# Patient Record
Sex: Male | Born: 1960 | Hispanic: No | Smoking: Never smoker
Health system: Southern US, Community
[De-identification: ages and names within clinical notes are randomized; demographics above are authoritative.]

## PROBLEM LIST (undated history)

## (undated) HISTORY — PX: TONSILLECTOMY: SUR1361

---

## 2011-12-15 ENCOUNTER — Ambulatory Visit (INDEPENDENT_AMBULATORY_CARE_PROVIDER_SITE_OTHER): Payer: 59 | Admitting: Physician Assistant

## 2011-12-15 VITALS — BP 115/70 | HR 75 | Temp 98.7°F | Resp 18 | Ht 68.0 in | Wt 159.0 lb

## 2011-12-15 DIAGNOSIS — R059 Cough, unspecified: Secondary | ICD-10-CM

## 2011-12-15 DIAGNOSIS — R05 Cough: Secondary | ICD-10-CM

## 2011-12-15 DIAGNOSIS — J019 Acute sinusitis, unspecified: Secondary | ICD-10-CM

## 2011-12-15 MED ORDER — CEFDINIR 300 MG PO CAPS: 300.0000 mg | ORAL_CAPSULE | Freq: Two times a day (BID) | ORAL | Status: AC

## 2011-12-15 MED ORDER — BENZONATATE 100 MG PO CAPS: 100.0000 mg | ORAL_CAPSULE | Freq: Three times a day (TID) | ORAL | Status: AC | PRN

## 2011-12-15 MED ORDER — IPRATROPIUM BROMIDE 0.06 % NA SOLN: 2.0000 | Freq: Three times a day (TID) | NASAL | Status: AC

## 2011-12-15 MED ORDER — IPRATROPIUM BROMIDE 0.06 % NA SOLN: 2.0000 | Freq: Three times a day (TID) | NASAL | Status: DC

## 2011-12-15 MED ORDER — CEFDINIR 300 MG PO CAPS: 300.0000 mg | ORAL_CAPSULE | Freq: Two times a day (BID) | ORAL | Status: DC

## 2011-12-15 MED ORDER — BENZONATATE 100 MG PO CAPS: 100.0000 mg | ORAL_CAPSULE | Freq: Three times a day (TID) | ORAL | Status: DC | PRN

## 2011-12-15 NOTE — Progress Notes (Signed)
    Patient ID: Bradley Duncan MRN: [id number], DOB: 29-Feb-1961, 51 y.o. Date of Encounter: 12/15/2011, 9:12 AM  Primary Physician: Lolita Patella, MD, MD  Chief Complaint:  Chief Complaint  Patient presents with  . Sinusitis    couple days    HPI: 51 y.o. year old male presents with a three day history of nasal congestion, post nasal drip, sore throat, sinus pressure, and cough. Afebrile. No chills. Patient was outside working in his yard moving some straw around and feels like this may have flared up his allergies followed Nasal congestion thick and green/yellow. Sinus pressure is the worst symptom. Cough is productive secondary to post nasal drip and not associated with time of day. Ears feel full, leading to sensation of muffled hearing. Has tried OTC cold preps without success. No GI complaints. Appetite slightly decreased. Prone to sinus infections.  No recent antibiotics, recent travels, or sick contacts   No leg trauma, sedentary periods, h/o cancer, or tobacco use.  No past medical history on file.   Home Meds: Prior to Admission medications   Not on File    Allergies: No Known Allergies  History   Social History  . Marital Status: Unknown    Spouse Name: N/A    Number of Children: N/A  . Years of Education: N/A   Occupational History  . Not on file.   Social History Main Topics  . Smoking status: Never Smoker   . Smokeless tobacco: Not on file  . Alcohol Use: Not on file  . Drug Use: Not on file  . Sexually Active: Not on file   Other Topics Concern  . Not on file   Social History Narrative  . No narrative on file     Review of Systems: Constitutional: negative for chills, fever, night sweats or weight changes Cardiovascular: negative for chest pain or palpitations Respiratory: negative for hemoptysis, wheezing, or shortness of breath Abdominal: negative for abdominal pain, nausea, vomiting or diarrhea Dermatological: negative for  rash Neurologic: negative for headache   Physical Exam: Blood pressure 115/70, pulse 75, temperature 98.7 F (37.1 C), temperature source Oral, resp. rate 18, height 5\' 8"  (1.727 m), weight 159 lb (72.122 kg)., Body mass index is 24.18 kg/(m^2). General: Well developed, well nourished, in no acute distress. Head: Normocephalic, atraumatic, eyes without discharge, sclera non-icteric, nares are congested. Bilateral auditory canals clear, TM's are without perforation, pearly grey with reflective cone of light bilaterally. Serous effusion bilaterally behind TM's. Frontal sinus TTP. Oral cavity moist, dentition normal. Posterior pharynx with post nasal drip and mild erythema. No peritonsillar abscess or tonsillar exudate. Neck: Supple. No thyromegaly. Full ROM. No lymphadenopathy. Lungs: Clear bilaterally to auscultation without wheezes, rales, or rhonchi. Breathing is unlabored.  Heart: RRR with S1 S2. No murmurs, rubs, or gallops appreciated. Msk:  Strength and tone normal for age. Extremities: No clubbing or cyanosis. No edema. Neuro: Alert and oriented X 3. Moves all extremities spontaneously. CNII-XII grossly in tact. Psych:  Responds to questions appropriately with a normal affect.     ASSESSMENT AND PLAN:  51 y.o. year old male with sinusitis -Omnicef 300 mg 1 po bid #20 no RF  -Atrovent NS 0.06% 2 sprays each nare bid prn #1 no RF -Tessalon Perles 100 mg 1 po tid prn cough #30 no RF  -Mucinex -Tylenol/Motrin prn -Rest/fluids -RTC precautions -RTC 3-5 days if no improvement  Signed, Eula Listen, PA-C 12/15/2011 9:12 AM

## 2012-09-18 ENCOUNTER — Ambulatory Visit: Payer: 59 | Admitting: Family Medicine

## 2012-09-18 VITALS — BP 118/81 | HR 93 | Temp 99.6°F | Resp 16 | Ht 68.0 in | Wt 159.0 lb

## 2012-09-18 DIAGNOSIS — J029 Acute pharyngitis, unspecified: Secondary | ICD-10-CM

## 2012-09-18 MED ORDER — HYDROCODONE-HOMATROPINE 5-1.5 MG/5ML PO SYRP: 5.0000 mL | ORAL_SOLUTION | ORAL | Status: DC | PRN

## 2012-09-18 NOTE — Progress Notes (Signed)
Subjective: Patient started feeling ill Friday night, but mostly Saturday. He has a bad sore throat. He has some pain into his ears. Not much of a cough. No rhinorrhea. Generally he is a healthy person. Takes an aspirin, but not a lot of prescription medications. Dr. Renato Gails is his regular physician.  Objective: Pleasant gentleman in no major distress but doesn't feel well. His TMs are normal. Throat erythematous without exudate. It's not even real red. Neck supple without significant nodes. Chest is clear to auscultation.  Assessment: Pharyngitis  Plan: Strep screen  Results for orders placed in visit on 09/18/12  POCT RAPID STREP A (OFFICE)      Result Value Range   Rapid Strep A Screen Negative  Negative

## 2012-09-18 NOTE — Patient Instructions (Addendum)
Plenty of fluids  Get sufficient rest  If getting worse contact me or return  If needed get the cough syrup filled and begin using it. It will make you drowsy.

## 2016-07-06 DIAGNOSIS — Z0000 Encounter for general adult medical examination without abnormal findings: Secondary | ICD-10-CM | POA: Diagnosis not present

## 2016-07-06 DIAGNOSIS — Z13220 Encounter for screening for lipoid disorders: Secondary | ICD-10-CM | POA: Diagnosis not present

## 2016-07-06 DIAGNOSIS — Z125 Encounter for screening for malignant neoplasm of prostate: Secondary | ICD-10-CM | POA: Diagnosis not present

## 2016-07-22 ENCOUNTER — Ambulatory Visit (INDEPENDENT_AMBULATORY_CARE_PROVIDER_SITE_OTHER): Payer: BLUE CROSS/BLUE SHIELD | Admitting: Emergency Medicine

## 2016-07-22 ENCOUNTER — Encounter: Payer: Self-pay | Admitting: Emergency Medicine

## 2016-07-22 VITALS — BP 124/82 | HR 90 | Temp 99.1°F | Resp 16 | Ht 68.0 in | Wt 161.0 lb

## 2016-07-22 DIAGNOSIS — R05 Cough: Secondary | ICD-10-CM | POA: Diagnosis not present

## 2016-07-22 DIAGNOSIS — R059 Cough, unspecified: Secondary | ICD-10-CM

## 2016-07-22 DIAGNOSIS — J111 Influenza due to unidentified influenza virus with other respiratory manifestations: Secondary | ICD-10-CM | POA: Diagnosis not present

## 2016-07-22 MED ORDER — OSELTAMIVIR PHOSPHATE 75 MG PO CAPS: 75.0000 mg | ORAL_CAPSULE | Freq: Two times a day (BID) | ORAL | 0 refills | Status: AC

## 2016-07-22 NOTE — Progress Notes (Signed)
Bradley Duncan 56 y.o.   Chief Complaint  Patient presents with  . Generalized Body Aches    Symptoms began yesterday, wife positive for Flu yesterday   . Chills  . Cough  . Nasal Congestion    HISTORY OF PRESENT ILLNESS: This is a 56 y.o. male complaining of flu symptoms; wife diagnosed with flu yesterday and started on Tamiflu.  Cough  Associated symptoms include chills, a fever, headaches and myalgias. Pertinent negatives include no chest pain, ear congestion, ear pain, hemoptysis, nasal congestion, postnasal drip, rash, rhinorrhea, sore throat, shortness of breath, weight loss or wheezing.  Influenza  This is a new problem. The current episode started yesterday. The problem occurs constantly. The problem has been rapidly worsening. Associated symptoms include arthralgias, chills, congestion, coughing, a fever, headaches, myalgias and weakness. Pertinent negatives include no abdominal pain, chest pain, nausea, rash, sore throat, swollen glands, urinary symptoms, vertigo or vomiting.     Prior to Admission medications   Medication Sig Start Date End Date Taking? Authorizing Provider  acetaminophen (TYLENOL) 500 MG chewable tablet Chew 1,000 mg by mouth every 6 (six) hours as needed for pain.   Yes Historical Provider, MD  aspirin 81 MG tablet Take 81 mg by mouth daily.   Yes Historical Provider, MD  Homeopathic Products [facility] COLD REMEDY NA) Place into the nose.   Yes Historical Provider, MD  ipratropium (ATROVENT) 0.06 % nasal spray Place 2 sprays into the nose 3 (three) times daily. 12/15/11 12/14/12  Raymon Mutton Dunn, PA-C    No Known Allergies  There are no active problems to display for this patient.   No past medical history on file.  Past Surgical History:  Procedure Laterality Date  . TONSILLECTOMY      Social History   Social History  . Marital status: Unknown    Spouse name: N/A  . Number of children: N/A  . Years of education: N/A   Occupational History  . Not  on file.   Social History Main Topics  . Smoking status: Never Smoker  . Smokeless tobacco: Never Used  . Alcohol use Not on file  . Drug use: Unknown  . Sexual activity: Not on file   Other Topics Concern  . Not on file   Social History Narrative  . No narrative on file    Family History  Problem Relation Age of Onset  . Hypertension Mother   . Heart disease Father      Review of Systems  Constitutional: Positive for chills and fever. Negative for weight loss.  HENT: Positive for congestion. Negative for ear pain, postnasal drip, rhinorrhea and sore throat.   Eyes: Negative.   Respiratory: Positive for cough. Negative for hemoptysis, shortness of breath and wheezing.   Cardiovascular: Negative for chest pain.  Gastrointestinal: Negative for abdominal pain, nausea and vomiting.  Genitourinary: Negative.   Musculoskeletal: Positive for arthralgias and myalgias.  Skin: Negative.  Negative for rash.  Neurological: Positive for weakness and headaches. Negative for vertigo.  Endo/Heme/Allergies: Negative.   Psychiatric/Behavioral: Negative.   All other systems reviewed and are negative.  Vitals:   07/22/16 0825  BP: 124/82  Pulse: 90  Resp: 16  Temp: 99.1 F (37.3 C)     Physical Exam  Constitutional: He is oriented to person, place, and time. He appears well-developed and well-nourished.  HENT:  Head: Normocephalic and atraumatic.  Nose: Nose normal.  Mouth/Throat: Oropharynx is clear and moist.  Eyes: Conjunctivae and EOM are normal. Pupils  are equal, round, and reactive to light.  Neck: Normal range of motion. Neck supple.  Cardiovascular: Normal rate, regular rhythm, normal heart sounds and intact distal pulses.   No murmur heard. Pulmonary/Chest: Effort normal and breath sounds normal. He has no wheezes.  Abdominal: Soft. Bowel sounds are normal. He exhibits no distension. There is no tenderness.  Musculoskeletal: Normal range of motion.  Lymphadenopathy:      He has no cervical adenopathy.  Neurological: He is alert and oriented to person, place, and time.  Skin: Skin is warm and dry. Capillary refill takes less than 2 seconds.  Psychiatric: He has a normal mood and affect. His behavior is normal.  Vitals reviewed.    ASSESSMENT & PLAN: Bradley Duncan was seen today for generalized body aches, chills, cough and nasal congestion.  Diagnoses and all orders for this visit:  Influenza with respiratory manifestation  Cough  Other orders -     oseltamivir (TAMIFLU) 75 MG capsule; Take 1 capsule (75 mg total) by mouth 2 (two) times daily.    Patient Instructions       IF you received an x-ray today, you will receive an invoice from [facility]. Please contact [facility] at [phone] with questions or concerns regarding your invoice.   IF you received labwork today, you will receive an invoice from [facility]. Please contact [facility] at [phone] with questions or concerns regarding your invoice.   Our billing staff will not be able to assist you with questions regarding bills from these companies.  You will be contacted with the lab results as soon as they are available. The fastest way to get your results is to activate your My Chart account. Instructions are located on the last page of this paperwork. If you have not heard from us regarding the results in 2 weeks, please contact this office.      Influenza, Adult Influenza ("the flu") is an infection in the lungs, nose, and throat (respiratory tract). It is caused by a virus. The flu causes many common cold symptoms, as well as a high fever and body aches. It can make you feel very sick. The flu spreads easily from person to person (is contagious). Getting a flu shot (influenza vaccination) every year is the best way to prevent the flu. Follow these instructions at home:  Take over-the-counter and prescription medicines only as told by your doctor.  Use a cool  mist humidifier to add moisture (humidity) to the air in your home. This can make it easier to breathe.  Rest as needed.  Drink enough fluid to keep your pee (urine) clear or pale yellow.  Cover your mouth and nose when you cough or sneeze.  Wash your hands with soap and water often, especially after you cough or sneeze. If you cannot use soap and water, use hand sanitizer.  Stay home from work or school as told by your doctor. Unless you are visiting your doctor, try to avoid leaving home until your fever has been gone for 24 hours without the use of medicine.  Keep all follow-up visits as told by your doctor. This is important. How is this prevented?  Getting a yearly (annual) flu shot is the best way to avoid getting the flu. You may get the flu shot in late summer, fall, or winter. Ask your doctor when you should get your flu shot.  Wash your hands often or use hand sanitizer often.  Avoid contact with people who are sick during cold and  flu season.  Eat healthy foods.  Drink plenty of fluids.  Get enough sleep.  Exercise regularly. Contact a doctor if:  You get new symptoms.  You have:  Chest pain.  Watery poop (diarrhea).  A fever.  Your cough gets worse.  You start to have more mucus.  You feel sick to your stomach (nauseous).  You throw up (vomit). Get help right away if:  You start to be short of breath or have trouble breathing.  Your skin or nails turn a bluish color.  You have very bad pain or stiffness in your neck.  You get a sudden headache.  You get sudden pain in your face or ear.  You cannot stop throwing up. This information is not intended to replace advice given to you by your health care provider. Make sure you discuss any questions you have with your health care provider. Document Released: 04/13/2008 Document Revised: 12/11/2015 Document Reviewed: 04/29/2015 Elsevier Interactive Patient Education  2017 Elsevier  Inc.      Edwina Barth, MD [facility]

## 2016-07-22 NOTE — Patient Instructions (Addendum)
     IF you received an x-ray today, you will receive an invoice from [facility]. Please contact [facility] at [phone] with questions or concerns regarding your invoice.   IF you received labwork today, you will receive an invoice from [facility]. Please contact [facility] at [phone] with questions or concerns regarding your invoice.   Our billing staff will not be able to assist you with questions regarding bills from these companies.  You will be contacted with the lab results as soon as they are available. The fastest way to get your results is to activate your My Chart account. Instructions are located on the last page of this paperwork. If you have not heard from us regarding the results in 2 weeks, please contact this office.      Influenza, Adult Influenza ("the flu") is an infection in the lungs, nose, and throat (respiratory tract). It is caused by a virus. The flu causes many common cold symptoms, as well as a high fever and body aches. It can make you feel very sick. The flu spreads easily from person to person (is contagious). Getting a flu shot (influenza vaccination) every year is the best way to prevent the flu. Follow these instructions at home:  Take over-the-counter and prescription medicines only as told by your doctor.  Use a cool mist humidifier to add moisture (humidity) to the air in your home. This can make it easier to breathe.  Rest as needed.  Drink enough fluid to keep your pee (urine) clear or pale yellow.  Cover your mouth and nose when you cough or sneeze.  Wash your hands with soap and water often, especially after you cough or sneeze. If you cannot use soap and water, use hand sanitizer.  Stay home from work or school as told by your doctor. Unless you are visiting your doctor, try to avoid leaving home until your fever has been gone for 24 hours without the use of medicine.  Keep all follow-up visits as told by your doctor.  This is important. How is this prevented?  Getting a yearly (annual) flu shot is the best way to avoid getting the flu. You may get the flu shot in late summer, fall, or winter. Ask your doctor when you should get your flu shot.  Wash your hands often or use hand sanitizer often.  Avoid contact with people who are sick during cold and flu season.  Eat healthy foods.  Drink plenty of fluids.  Get enough sleep.  Exercise regularly. Contact a doctor if:  You get new symptoms.  You have:  Chest pain.  Watery poop (diarrhea).  A fever.  Your cough gets worse.  You start to have more mucus.  You feel sick to your stomach (nauseous).  You throw up (vomit). Get help right away if:  You start to be short of breath or have trouble breathing.  Your skin or nails turn a bluish color.  You have very bad pain or stiffness in your neck.  You get a sudden headache.  You get sudden pain in your face or ear.  You cannot stop throwing up. This information is not intended to replace advice given to you by your health care provider. Make sure you discuss any questions you have with your health care provider. Document Released: 04/13/2008 Document Revised: 12/11/2015 Document Reviewed: 04/29/2015 Elsevier Interactive Patient Education  2017 Elsevier Inc.  

## 2017-07-25 DIAGNOSIS — Z0000 Encounter for general adult medical examination without abnormal findings: Secondary | ICD-10-CM | POA: Diagnosis not present

## 2017-07-25 DIAGNOSIS — Z13220 Encounter for screening for lipoid disorders: Secondary | ICD-10-CM | POA: Diagnosis not present

## 2017-08-25 DIAGNOSIS — R2232 Localized swelling, mass and lump, left upper limb: Secondary | ICD-10-CM | POA: Diagnosis not present

## 2018-04-13 DIAGNOSIS — H9313 Tinnitus, bilateral: Secondary | ICD-10-CM | POA: Diagnosis not present

## 2018-04-13 DIAGNOSIS — L03011 Cellulitis of right finger: Secondary | ICD-10-CM | POA: Diagnosis not present

## 2018-04-20 DIAGNOSIS — H6121 Impacted cerumen, right ear: Secondary | ICD-10-CM | POA: Diagnosis not present

## 2018-04-20 DIAGNOSIS — H6061 Unspecified chronic otitis externa, right ear: Secondary | ICD-10-CM | POA: Diagnosis not present

## 2018-04-20 DIAGNOSIS — H9313 Tinnitus, bilateral: Secondary | ICD-10-CM | POA: Diagnosis not present

## 2018-06-13 DIAGNOSIS — R1310 Dysphagia, unspecified: Secondary | ICD-10-CM | POA: Diagnosis not present

## 2018-06-19 ENCOUNTER — Other Ambulatory Visit: Payer: Self-pay | Admitting: Family Medicine

## 2018-06-19 DIAGNOSIS — R1310 Dysphagia, unspecified: Secondary | ICD-10-CM

## 2018-06-23 ENCOUNTER — Ambulatory Visit
Admission: RE | Admit: 2018-06-23 | Discharge: 2018-06-23 | Disposition: A | Discharge status: Home / Self Care | Payer: BLUE CROSS/BLUE SHIELD | Source: Ambulatory Visit | Attending: Family Medicine | Admitting: Family Medicine

## 2018-06-23 DIAGNOSIS — R1310 Dysphagia, unspecified: Secondary | ICD-10-CM

## 2018-06-23 DIAGNOSIS — K449 Diaphragmatic hernia without obstruction or gangrene: Secondary | ICD-10-CM | POA: Diagnosis not present

## 2018-07-27 DIAGNOSIS — R1310 Dysphagia, unspecified: Secondary | ICD-10-CM | POA: Diagnosis not present

## 2018-08-01 DIAGNOSIS — R6882 Decreased libido: Secondary | ICD-10-CM | POA: Diagnosis not present

## 2018-08-01 DIAGNOSIS — Z13220 Encounter for screening for lipoid disorders: Secondary | ICD-10-CM | POA: Diagnosis not present

## 2018-08-01 DIAGNOSIS — Z125 Encounter for screening for malignant neoplasm of prostate: Secondary | ICD-10-CM | POA: Diagnosis not present

## 2018-08-01 DIAGNOSIS — Z1159 Encounter for screening for other viral diseases: Secondary | ICD-10-CM | POA: Diagnosis not present

## 2018-08-01 DIAGNOSIS — Z0000 Encounter for general adult medical examination without abnormal findings: Secondary | ICD-10-CM | POA: Diagnosis not present

## 2018-08-01 DIAGNOSIS — Z23 Encounter for immunization: Secondary | ICD-10-CM | POA: Diagnosis not present

## 2018-08-03 DIAGNOSIS — R2232 Localized swelling, mass and lump, left upper limb: Secondary | ICD-10-CM | POA: Diagnosis not present

## 2018-08-09 DIAGNOSIS — R933 Abnormal findings on diagnostic imaging of other parts of digestive tract: Secondary | ICD-10-CM | POA: Diagnosis not present

## 2018-08-09 DIAGNOSIS — K222 Esophageal obstruction: Secondary | ICD-10-CM | POA: Diagnosis not present

## 2018-08-09 DIAGNOSIS — K2970 Gastritis, unspecified, without bleeding: Secondary | ICD-10-CM | POA: Diagnosis not present

## 2018-08-09 DIAGNOSIS — K449 Diaphragmatic hernia without obstruction or gangrene: Secondary | ICD-10-CM | POA: Diagnosis not present

## 2018-08-09 DIAGNOSIS — K210 Gastro-esophageal reflux disease with esophagitis: Secondary | ICD-10-CM | POA: Diagnosis not present

## 2018-08-09 DIAGNOSIS — R1310 Dysphagia, unspecified: Secondary | ICD-10-CM | POA: Diagnosis not present

## 2019-01-13 IMAGING — RF DG UGI W/ HIGH DENSITY W/KUB
13 of 15 series · 14 of 24 positions shown · non-contrast
Comparison: None.

CLINICAL DATA: Esophageal dysphagia.  Food getting stuck.

EXAM:
UPPER GI SERIES WITH KUB
TECHNIQUE: After obtaining a scout radiograph a routine upper GI series was
performed using thin and high density barium.
FLUOROSCOPY TIME:  Fluoroscopy Time:  3 minutes and 0 seconds
Radiation Exposure Index (if provided by the fluoroscopic device):
100 mGy
Number of Acquired Spot Images: 4

[Series 1: one shot · 0.15mm/px · 1 of 1 slices shown (1 of 5)]
[im 1/1]
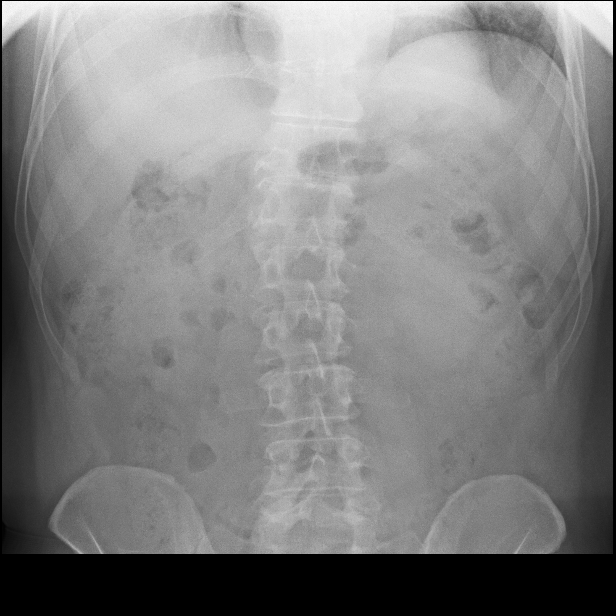

[Series 2: sequence · 1 of 11 frames shown (1 of 8)]
[frame 10/11]
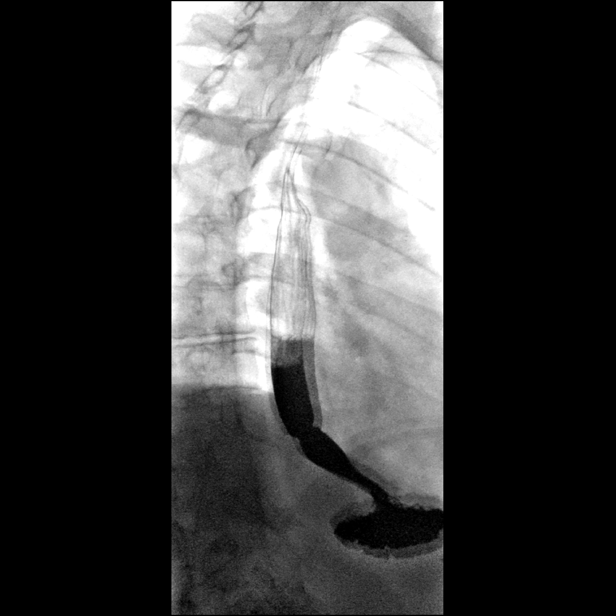

[Series 4: sequence · 0.29mm/px · 1 of 8 frames shown (2 of 8)]
[frame 5/8]
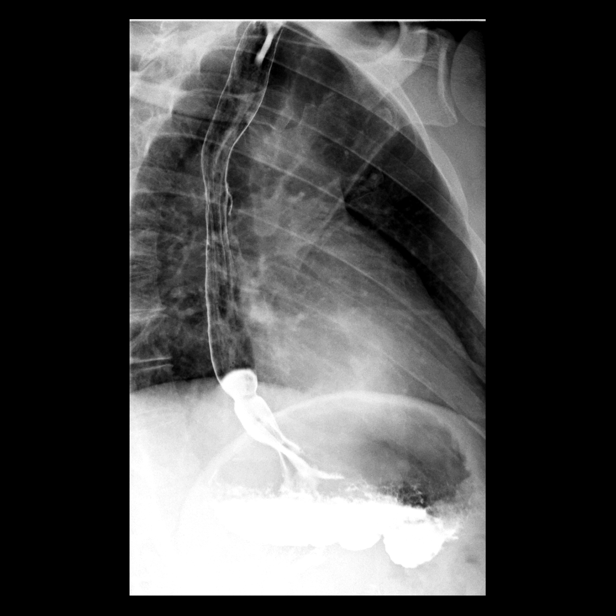

[Series 6: sequence · 0.29mm/px · 1 of 1 slices shown (3 of 8)]
[im 1/1]
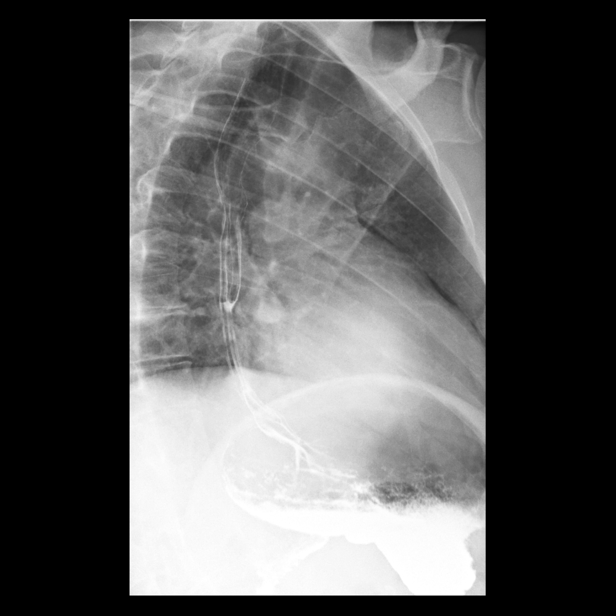

[Series 7: one shot · 0.15mm/px · 2 of 5 slices shown (2 of 5)]
[im 2/5]
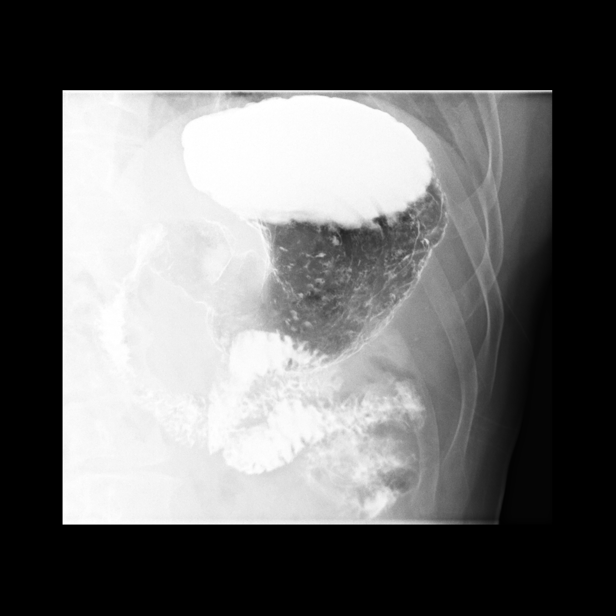
[im 5/5]
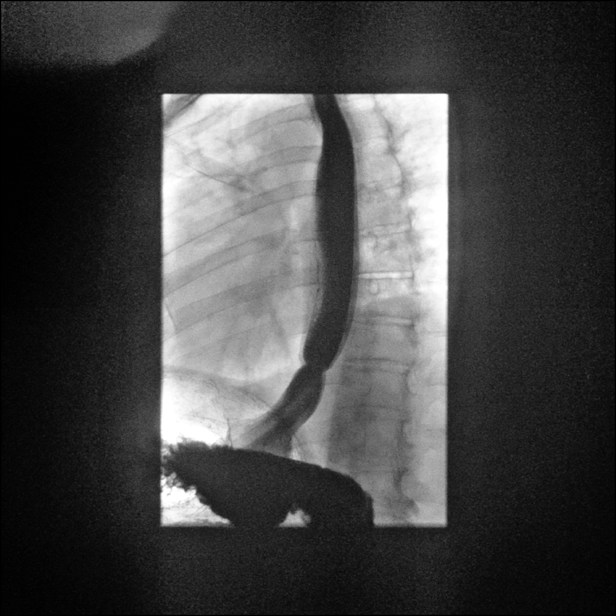

[Series 8: sequence · 1 of 11 frames shown (4 of 8)]
[frame 10/11]
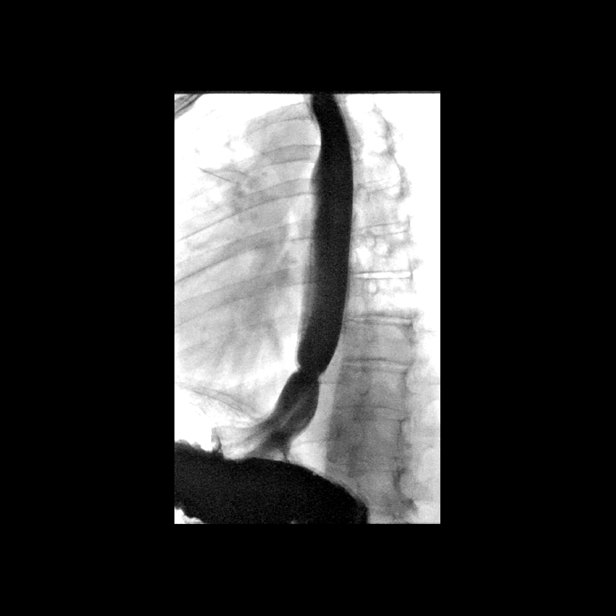

[Series 9: one shot · 1 of 1 slices shown (3 of 5)]
[im 1/1]
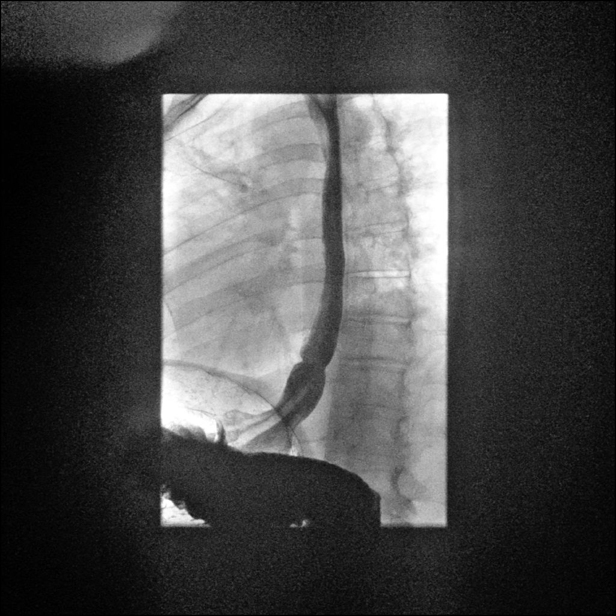

[Series 10: sequence · 1 of 7 frames shown (5 of 8)]
[frame 6/7]
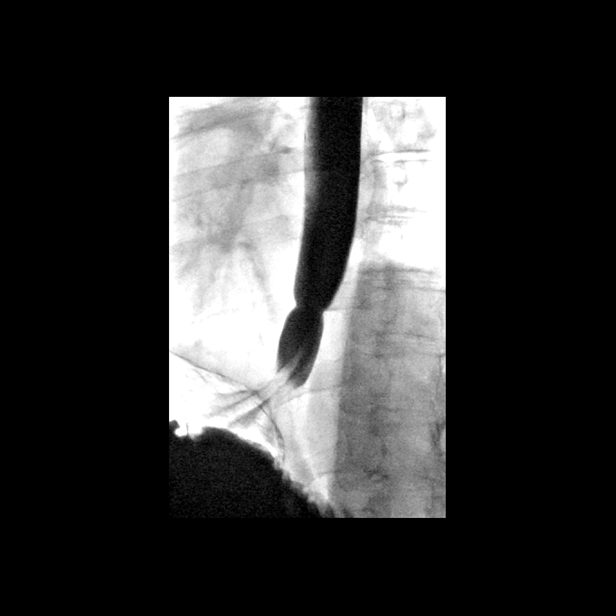

[Series 12: sequence · 1 of 11 frames shown (6 of 8)]
[frame 4/11]
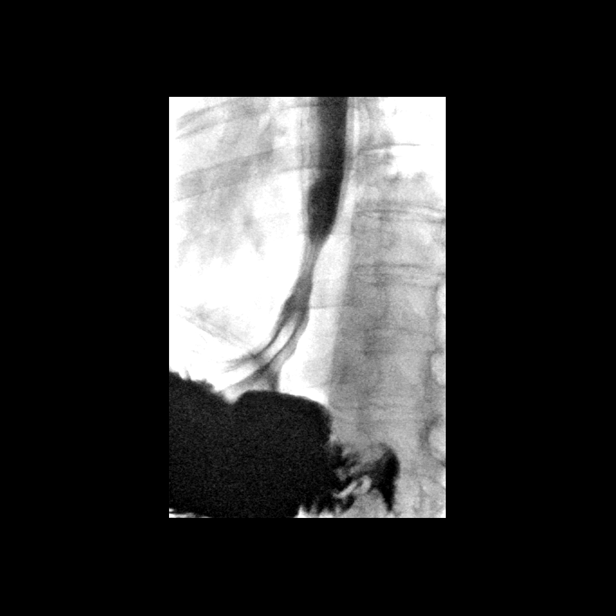

[Series 13: one shot · 1 of 2 slices shown (4 of 5)]
[im 2/2]
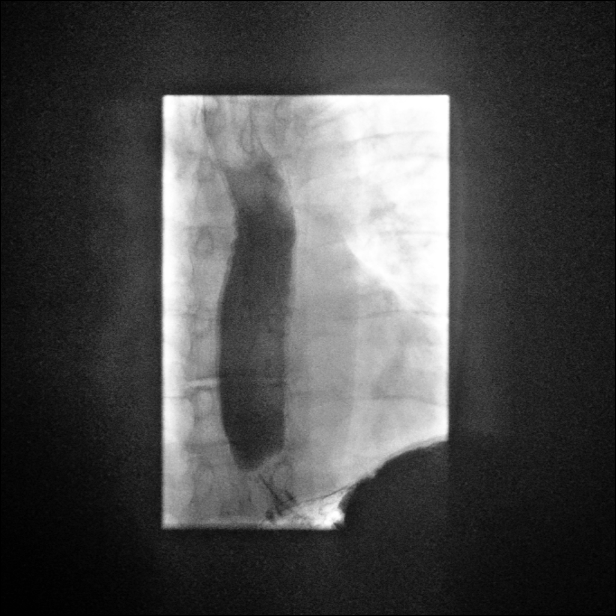

[Series 14: sequence · 1 of 4 frames shown (7 of 8)]
[frame 1/4]
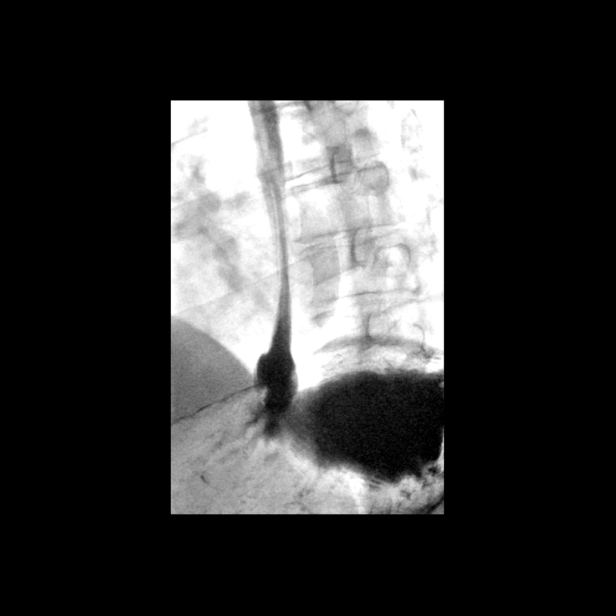

[Series 16: sequence · 1 of 10 frames shown (8 of 8)]
[frame 2/10]
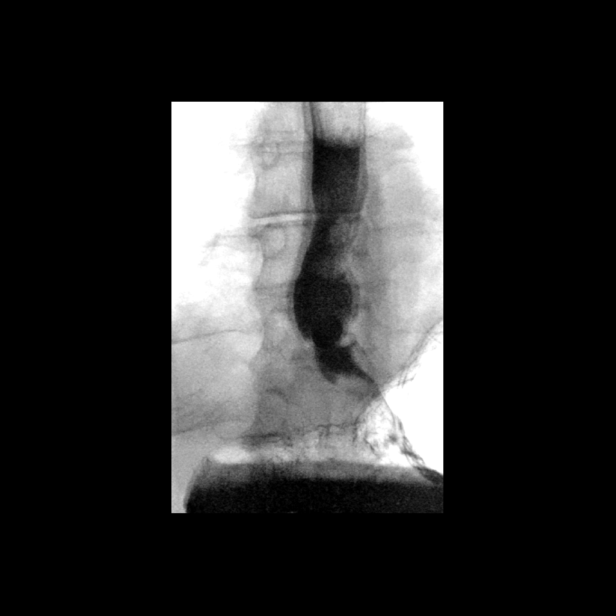

[Series 17: one shot · 1 of 1 slices shown (5 of 5)]
[im 1/1]
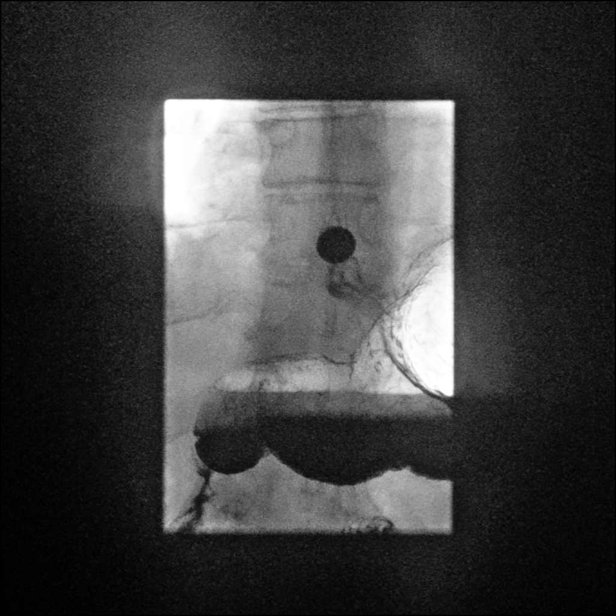

[14 of 24 positions shown; findings below may reference images not displayed]

FINDINGS: Initial barium swallows demonstrate normal esophageal motility.
Normal mucosal folds. No mass is identified.

There is a small sliding-type hiatal hernia and a prominent mucosal
B ring (Schatzki's ring). The 13 mm barium pill would not pass
through this area.

Inducible GE reflux was demonstrated.

The stomach, duodenum bulb and C-loop are normal. Normal mucosal
folds and no ulcer or gastritis.
IMPRESSION: 1. Small sliding-type hiatal hernia with a prominent mucosal B ring
(Schatzki's ring) at the GE junction. The 13 mm barium pill would
not pass through this area.
2. Inducible GE reflux.
3. Normal appearance of the stomach and duodenum.

## 2019-08-27 DIAGNOSIS — Z0000 Encounter for general adult medical examination without abnormal findings: Secondary | ICD-10-CM | POA: Diagnosis not present

## 2019-08-27 DIAGNOSIS — M79674 Pain in right toe(s): Secondary | ICD-10-CM | POA: Diagnosis not present

## 2019-08-27 DIAGNOSIS — Z13220 Encounter for screening for lipoid disorders: Secondary | ICD-10-CM | POA: Diagnosis not present

## 2019-08-27 DIAGNOSIS — Z125 Encounter for screening for malignant neoplasm of prostate: Secondary | ICD-10-CM | POA: Diagnosis not present

## 2019-08-27 DIAGNOSIS — Z23 Encounter for immunization: Secondary | ICD-10-CM | POA: Diagnosis not present

## 2019-09-27 DIAGNOSIS — R1310 Dysphagia, unspecified: Secondary | ICD-10-CM | POA: Diagnosis not present

## 2019-09-27 DIAGNOSIS — K209 Esophagitis, unspecified without bleeding: Secondary | ICD-10-CM | POA: Diagnosis not present

## 2019-09-27 DIAGNOSIS — Z86010 Personal history of colonic polyps: Secondary | ICD-10-CM | POA: Diagnosis not present

## 2019-10-04 DIAGNOSIS — Z1159 Encounter for screening for other viral diseases: Secondary | ICD-10-CM | POA: Diagnosis not present

## 2019-10-08 DIAGNOSIS — K2100 Gastro-esophageal reflux disease with esophagitis, without bleeding: Secondary | ICD-10-CM | POA: Diagnosis not present

## 2019-11-27 DIAGNOSIS — Z23 Encounter for immunization: Secondary | ICD-10-CM | POA: Diagnosis not present

## 2019-12-19 DIAGNOSIS — Z03818 Encounter for observation for suspected exposure to other biological agents ruled out: Secondary | ICD-10-CM | POA: Diagnosis not present

## 2019-12-19 DIAGNOSIS — Z20822 Contact with and (suspected) exposure to covid-19: Secondary | ICD-10-CM | POA: Diagnosis not present

## 2020-07-22 DIAGNOSIS — Z03818 Encounter for observation for suspected exposure to other biological agents ruled out: Secondary | ICD-10-CM | POA: Diagnosis not present

## 2020-09-02 DIAGNOSIS — Z13220 Encounter for screening for lipoid disorders: Secondary | ICD-10-CM | POA: Diagnosis not present

## 2020-09-02 DIAGNOSIS — R030 Elevated blood-pressure reading, without diagnosis of hypertension: Secondary | ICD-10-CM | POA: Diagnosis not present

## 2020-09-02 DIAGNOSIS — Z0000 Encounter for general adult medical examination without abnormal findings: Secondary | ICD-10-CM | POA: Diagnosis not present

## 2020-09-02 DIAGNOSIS — Z125 Encounter for screening for malignant neoplasm of prostate: Secondary | ICD-10-CM | POA: Diagnosis not present

## 2020-11-11 DIAGNOSIS — Z13220 Encounter for screening for lipoid disorders: Secondary | ICD-10-CM | POA: Diagnosis not present

## 2021-07-07 DIAGNOSIS — Z86010 Personal history of colonic polyps: Secondary | ICD-10-CM | POA: Diagnosis not present

## 2021-09-08 DIAGNOSIS — Z125 Encounter for screening for malignant neoplasm of prostate: Secondary | ICD-10-CM | POA: Diagnosis not present

## 2021-09-08 DIAGNOSIS — J309 Allergic rhinitis, unspecified: Secondary | ICD-10-CM | POA: Diagnosis not present

## 2021-09-08 DIAGNOSIS — Z0000 Encounter for general adult medical examination without abnormal findings: Secondary | ICD-10-CM | POA: Diagnosis not present

## 2021-09-08 DIAGNOSIS — R079 Chest pain, unspecified: Secondary | ICD-10-CM | POA: Diagnosis not present

## 2021-09-08 DIAGNOSIS — E7800 Pure hypercholesterolemia, unspecified: Secondary | ICD-10-CM | POA: Diagnosis not present

## 2021-11-10 DIAGNOSIS — E7800 Pure hypercholesterolemia, unspecified: Secondary | ICD-10-CM | POA: Diagnosis not present

## 2022-06-02 DIAGNOSIS — D225 Melanocytic nevi of trunk: Secondary | ICD-10-CM | POA: Diagnosis not present

## 2022-06-02 DIAGNOSIS — L821 Other seborrheic keratosis: Secondary | ICD-10-CM | POA: Diagnosis not present

## 2022-06-02 DIAGNOSIS — D485 Neoplasm of uncertain behavior of skin: Secondary | ICD-10-CM | POA: Diagnosis not present

## 2022-06-02 DIAGNOSIS — B078 Other viral warts: Secondary | ICD-10-CM | POA: Diagnosis not present

## 2022-06-02 DIAGNOSIS — L814 Other melanin hyperpigmentation: Secondary | ICD-10-CM | POA: Diagnosis not present

## 2022-06-28 DIAGNOSIS — D225 Melanocytic nevi of trunk: Secondary | ICD-10-CM | POA: Diagnosis not present

## 2022-06-28 DIAGNOSIS — D485 Neoplasm of uncertain behavior of skin: Secondary | ICD-10-CM | POA: Diagnosis not present

## 2022-10-01 DIAGNOSIS — Z125 Encounter for screening for malignant neoplasm of prostate: Secondary | ICD-10-CM | POA: Diagnosis not present

## 2022-10-01 DIAGNOSIS — E7800 Pure hypercholesterolemia, unspecified: Secondary | ICD-10-CM | POA: Diagnosis not present

## 2022-10-01 DIAGNOSIS — Z0000 Encounter for general adult medical examination without abnormal findings: Secondary | ICD-10-CM | POA: Diagnosis not present

## 2022-10-01 DIAGNOSIS — Z79899 Other long term (current) drug therapy: Secondary | ICD-10-CM | POA: Diagnosis not present

## 2023-06-09 DIAGNOSIS — B078 Other viral warts: Secondary | ICD-10-CM | POA: Diagnosis not present
